# Patient Record
Sex: Female | Born: 1945
Health system: Southern US, Community
[De-identification: ages and names within clinical notes are randomized; demographics above are authoritative.]

## PROBLEM LIST (undated history)

## (undated) DIAGNOSIS — E785 Hyperlipidemia, unspecified: Secondary | ICD-10-CM

## (undated) DIAGNOSIS — E6609 Other obesity due to excess calories: Secondary | ICD-10-CM

## (undated) DIAGNOSIS — I10 Essential (primary) hypertension: Secondary | ICD-10-CM

---

## 1995-06-25 HISTORY — PX: ABDOMINAL HYSTERECTOMY: SHX81

## 2016-03-10 ENCOUNTER — Emergency Department (HOSPITAL_COMMUNITY): Payer: Medicare Other

## 2016-03-10 ENCOUNTER — Encounter (HOSPITAL_COMMUNITY): Payer: Self-pay

## 2016-03-10 ENCOUNTER — Inpatient Hospital Stay (HOSPITAL_COMMUNITY)
Admission: EM | Admit: 2016-03-10 | Discharge: 2016-03-12 | DRG: 305 | Disposition: A | Discharge status: Home / Self Care | Payer: Medicare Other | Attending: Family Medicine | Admitting: Family Medicine

## 2016-03-10 DIAGNOSIS — F17200 Nicotine dependence, unspecified, uncomplicated: Secondary | ICD-10-CM | POA: Diagnosis present

## 2016-03-10 DIAGNOSIS — I160 Hypertensive urgency: Principal | ICD-10-CM | POA: Diagnosis present

## 2016-03-10 DIAGNOSIS — R0602 Shortness of breath: Secondary | ICD-10-CM | POA: Diagnosis not present

## 2016-03-10 DIAGNOSIS — E6609 Other obesity due to excess calories: Secondary | ICD-10-CM | POA: Diagnosis present

## 2016-03-10 DIAGNOSIS — I159 Secondary hypertension, unspecified: Secondary | ICD-10-CM | POA: Diagnosis present

## 2016-03-10 DIAGNOSIS — Z90710 Acquired absence of both cervix and uterus: Secondary | ICD-10-CM

## 2016-03-10 DIAGNOSIS — E785 Hyperlipidemia, unspecified: Secondary | ICD-10-CM | POA: Diagnosis present

## 2016-03-10 DIAGNOSIS — I10 Essential (primary) hypertension: Secondary | ICD-10-CM | POA: Diagnosis present

## 2016-03-10 DIAGNOSIS — Z800 Family history of malignant neoplasm of digestive organs: Secondary | ICD-10-CM

## 2016-03-10 DIAGNOSIS — Z8249 Family history of ischemic heart disease and other diseases of the circulatory system: Secondary | ICD-10-CM

## 2016-03-10 DIAGNOSIS — Z825 Family history of asthma and other chronic lower respiratory diseases: Secondary | ICD-10-CM

## 2016-03-10 DIAGNOSIS — Z6838 Body mass index (BMI) 38.0-38.9, adult: Secondary | ICD-10-CM

## 2016-03-10 DIAGNOSIS — Z8541 Personal history of malignant neoplasm of cervix uteri: Secondary | ICD-10-CM

## 2016-03-10 DIAGNOSIS — E049 Nontoxic goiter, unspecified: Secondary | ICD-10-CM | POA: Diagnosis present

## 2016-03-10 DIAGNOSIS — J42 Unspecified chronic bronchitis: Secondary | ICD-10-CM | POA: Diagnosis present

## 2016-03-10 DIAGNOSIS — Z823 Family history of stroke: Secondary | ICD-10-CM

## 2016-03-10 DIAGNOSIS — I472 Ventricular tachycardia: Secondary | ICD-10-CM | POA: Diagnosis present

## 2016-03-10 HISTORY — DX: Other obesity due to excess calories: E66.09

## 2016-03-10 HISTORY — DX: Essential (primary) hypertension: I10

## 2016-03-10 HISTORY — DX: Hyperlipidemia, unspecified: E78.5

## 2016-03-10 LAB — COMPREHENSIVE METABOLIC PANEL
ALBUMIN: 3.9 g/dL (ref 3.5–5.0)
ALT: 16 U/L (ref 14–54)
ANION GAP: 10 (ref 5–15)
AST: 27 U/L (ref 15–41)
Alkaline Phosphatase: 93 U/L (ref 38–126)
BILIRUBIN TOTAL: 0.7 mg/dL (ref 0.3–1.2)
BUN: 12 mg/dL (ref 6–20)
CO2: 22 mmol/L (ref 22–32)
Calcium: 9 mg/dL (ref 8.9–10.3)
Chloride: 104 mmol/L (ref 101–111)
Creatinine, Ser: 0.85 mg/dL (ref 0.44–1.00)
GFR calc Af Amer: >60 mL/min (ref >60)
GFR calc non Af Amer: >60 mL/min (ref >60)
GLUCOSE: 95 mg/dL (ref 65–99)
POTASSIUM: 4.1 mmol/L (ref 3.5–5.1)
Sodium: 136 mmol/L (ref 135–145)
TOTAL PROTEIN: 8.1 g/dL (ref 6.5–8.1)

## 2016-03-10 LAB — CBC
HCT: 41.7 % (ref 36.0–46.0)
Hemoglobin: 13.2 g/dL (ref 12.0–15.0)
MCH: 24.1 pg — AB (ref 26.0–34.0)
MCHC: 31.7 g/dL (ref 30.0–36.0)
MCV: 76.1 fL — ABNORMAL LOW (ref 78.0–100.0)
PLATELETS: 225 10*3/uL (ref 150–400)
RBC: 5.48 MIL/uL — AB (ref 3.87–5.11)
RDW: 17.2 % — ABNORMAL HIGH (ref 11.5–15.5)
WBC: 8.1 10*3/uL (ref 4.0–10.5)

## 2016-03-10 LAB — I-STAT TROPONIN, ED: TROPONIN I, POC: 0.01 ng/mL (ref 0.00–0.08)

## 2016-03-10 LAB — D-DIMER, QUANTITATIVE (NOT AT ARMC): D DIMER QUANT: 1.24 ug{FEU}/mL — AB (ref 0.00–0.50)

## 2016-03-10 MED ORDER — ALBUTEROL SULFATE (2.5 MG/3ML) 0.083% IN NEBU
5.0000 mg | INHALATION_SOLUTION | Freq: Once | RESPIRATORY_TRACT | Status: AC
  Administered 2016-03-10: 5 mg via RESPIRATORY_TRACT
  Filled 2016-03-10: qty 6

## 2016-03-10 MED ORDER — ASPIRIN 325 MG PO TABS
325.0000 mg | ORAL_TABLET | Freq: Once | ORAL | Status: AC
  Administered 2016-03-11: 325 mg via ORAL
  Filled 2016-03-10: qty 1

## 2016-03-10 MED ORDER — IPRATROPIUM-ALBUTEROL 0.5-2.5 (3) MG/3ML IN SOLN
3.0000 mL | Freq: Once | RESPIRATORY_TRACT | Status: AC
  Administered 2016-03-11: 3 mL via RESPIRATORY_TRACT
  Filled 2016-03-10: qty 3

## 2016-03-10 MED ORDER — IOPAMIDOL (ISOVUE-370) INJECTION 76%
INTRAVENOUS | Status: AC
  Administered 2016-03-10: 70 mL
  Filled 2016-03-10: qty 100

## 2016-03-10 MED ORDER — LABETALOL HCL 5 MG/ML IV SOLN
20.0000 mg | Freq: Once | INTRAVENOUS | Status: AC
  Administered 2016-03-11: 20 mg via INTRAVENOUS
  Filled 2016-03-10: qty 4

## 2016-03-10 NOTE — ED Provider Notes (Signed)
Complains of mild cough, nonproductive and shortness of breath onset earlier today. She denies any chest pain denies fever. Feels somewhat improved after nebulized treatment here. No other associated symptoms. Cardiac risk factors smoker, hypercholesterolemia, hypertension, obesity. Heart score +5 based on risk factors, ekg criteria, age, history   Doug SouSam Carlyle Achenbach, MD 03/11/16 [phone]

## 2016-03-10 NOTE — ED Triage Notes (Signed)
Patient complains of shortness of breath that started this afternoon. No CP. States that it seems hard to take a deep breath. Speaking full sentences, no edema, NAD. No medical hx

## 2016-03-10 NOTE — ED Provider Notes (Signed)
[facility]-EMERGENCY DEPT Provider Note   CSN: [id number] Arrival date & time: 03/10/16  1740     History   Chief Complaint Chief Complaint  Patient presents with  . Shortness of Breath    HPI Mary Cherry is a 70 y.o. female.  Patient is a 70 year old female with hx of HTN and HLD, which she reports she has not been taking medications for the past 10 years, presents the ED with complaint of shortness of breath, onset this afternoon. Patient reports while she was at home with her family, she notes the house began to feel very warm and she began to have difficulty breathing. She states "I felt like I just couldn't catch my breath". She notes after she went into her daughter's room by herself her breathing mildly improved but states due to still feeling short of breath her and her daughter decided to come to the ED for evaluation. Patient states since arrival to the ED her shortness of breath has improved. Patient notes she has had nasal congestion, rhinorrhea and nonproductive cough with mild wheezing for the past 2 days. She reports taking TheraFlu at home with mild relief of nasal congestion. Denies fever, chills, headache, lightheadedness, dizziness, sore throat, hemoptysis, CP, palpitations, abdominal pain, N/V/D, urinary sxs, weakness. Denies any known sick contacts. Patient endorses smoking 1/2-1 PPD. Denies personal or family history of heart disease. Patient reports history of cervical cancer but states she is status post total hysterectomy. Denies any recent hospitalizations/immobilizations, recent surgery, leg swelling, recent long car rides or airplane travel, history of DVTs.      Past Medical History:  Diagnosis Date  . Essential hypertension   . Hyperlipidemia   . Obesity due to excess calories     There are no active problems to display for this patient.   Past Surgical History:  Procedure Laterality Date  . ABDOMINAL HYSTERECTOMY  1997  . CESAREAN SECTION  1984    OB  History    No data available       Home Medications    Prior to Admission medications   Medication Sig Start Date End Date Taking? Authorizing Provider  aspirin EC 81 MG tablet Take 81 mg by mouth daily as needed for mild pain.   Yes Historical Provider, MD  naproxen sodium (ALEVE) 220 MG tablet Take 440 mg by mouth daily as needed (for pain).   Yes Historical Provider, MD    Family History Family History  Problem Relation Age of Onset  . Hypertension Mother   . Emphysema Mother   . Pancreatic cancer Mother   . Heart attack Father   . Hypertension Brother   . Stroke Maternal Aunt     Social History Social History  Substance Use Topics  . Smoking status: Current Every Day Smoker    Types: Cigarettes  . Smokeless tobacco: Never Used  . Alcohol use Not on file     Allergies   Flagyl [metronidazole]   Review of Systems Review of Systems  HENT: Positive for congestion and rhinorrhea.   Respiratory: Positive for cough, shortness of breath and wheezing.   All other systems reviewed and are negative.    Physical Exam Updated Vital Signs BP 147/67   Pulse 81   Temp 98.7 F (37.1 C) (Oral)   Resp 20   LMP  (Exact Date) Comment: hysterectomy   SpO2 (!) 89%   Physical Exam  Constitutional: She is oriented to person, place, and time. She appears well-developed and well-nourished. No  distress.  Morbidly obese female  HENT:  Head: Normocephalic and atraumatic.  Right Ear: Tympanic membrane normal.  Left Ear: Tympanic membrane normal.  Nose: Nose normal. Right sinus exhibits no maxillary sinus tenderness and no frontal sinus tenderness. Left sinus exhibits no maxillary sinus tenderness and no frontal sinus tenderness.  Mouth/Throat: Uvula is midline, oropharynx is clear and moist and mucous membranes are normal. No oropharyngeal exudate, posterior oropharyngeal edema, posterior oropharyngeal erythema or tonsillar abscesses. No tonsillar exudate.  Eyes: Conjunctivae  and EOM are normal. Right eye exhibits no discharge. Left eye exhibits no discharge. No scleral icterus.  Neck: Normal range of motion. Neck supple.  Cardiovascular: Regular rhythm, normal heart sounds and intact distal pulses.   Tachycardic, HR 104-110 during examination  Pulmonary/Chest: Effort normal. No respiratory distress. She has wheezes (mild end-expiratory wheezes noted bilaterally in lower lobes). She has no rales. She exhibits no tenderness.  Abdominal: Soft. Bowel sounds are normal. She exhibits no distension and no mass. There is no tenderness. There is no rebound and no guarding. No hernia.  Musculoskeletal: Normal range of motion. She exhibits no edema.  Lymphadenopathy:    She has no cervical adenopathy.  Neurological: She is alert and oriented to person, place, and time.  Skin: Skin is warm and dry. She is not diaphoretic.  Nursing note and vitals reviewed.    ED Treatments / Results  Labs (all labs ordered are listed, but only abnormal results are displayed) Labs Reviewed  CBC - Abnormal; Notable for the following:       Result Value   RBC 5.48 (*)    MCV 76.1 (*)    MCH 24.1 (*)    RDW 17.2 (*)    All other components within normal limits  D-DIMER, QUANTITATIVE (NOT AT [facility]) - Abnormal; Notable for the following:    D-Dimer, Quant 1.24 (*)    All other components within normal limits  COMPREHENSIVE METABOLIC PANEL  I-STAT TROPOININ, ED  I-STAT TROPOININ, ED    EKG  EKG Interpretation  Date/Time:  Sunday March 10 2016 17:51:10 EDT Ventricular Rate:  114 PR Interval:  158 QRS Duration: 92 QT Interval:  350 QTC Calculation: 482 R Axis:   47 Text Interpretation:  Sinus tachycardia with occasional Premature ventricular complexes Marked ST abnormality, possible inferior subendocardial injury Abnormal ECG No old tracing to compare Confirmed by Ethelda Chick  MD, SAM [phone]) on 03/10/2016 11:04:36 PM       Radiology Dg Chest 2 View  Result Date:  03/10/2016 CLINICAL DATA:  Shortness of breath EXAM: CHEST  2 VIEW COMPARISON:  None. FINDINGS: Normal heart size. Aortic atherosclerosis. Otherwise normal mediastinal contour. No pneumothorax. No pleural effusion. No pulmonary edema. No acute consolidative airspace disease. Slightly distorted appearance of the interstitial markings suggests emphysema. There is narrowing of the right deviated trachea at the level of the thoracic inlet. IMPRESSION: 1. No acute cardiopulmonary disease. 2. Suggestion of emphysema. 3. Aortic atherosclerosis. 4. Narrowing of the right deviated trachea at the level of the thoracic inlet, most commonly due to an asymmetric goiter. Recommend correlation with thyroid ultrasound. Electronically Signed   By: Delbert Phenix M.D.   On: 03/10/2016 18:21   Ct Angio Chest Pe W And/or Wo Contrast  Result Date: 03/11/2016 CLINICAL DATA:  Acute onset of shortness of breath. Recent cold. Initial encounter. EXAM: CT ANGIOGRAPHY CHEST WITH CONTRAST TECHNIQUE: Multidetector CT imaging of the chest was performed using the standard protocol during bolus administration of intravenous contrast. Multiplanar CT image  reconstructions and MIPs were obtained to evaluate the vascular anatomy. CONTRAST:  70 mL of Isovue 370 IV contrast COMPARISON:  Chest radiograph performed earlier today at 5:58 p.m. at the skull is FINDINGS: Cardiovascular:  There is no evidence of pulmonary embolus. The heart is unremarkable in appearance. Scattered coronary artery calcifications are seen. Scattered calcification is also noted at the aortic arch and proximal great vessels. Mediastinum/Nodes: The mediastinum is otherwise unremarkable in appearance. No mediastinal lymphadenopathy is seen. No pericardial effusion is identified. There is marked diffuse enlargement of the thyroid, particularly on the left, with rightward displacement and narrowing of the trachea at this level. No axillary lymphadenopathy is seen. Lungs/Pleura:  Minimal bilateral atelectasis is noted. The lungs are otherwise grossly clear. No focal consolidation, pleural effusion or pneumothorax is seen. No masses are identified. Upper Abdomen: The visualized portions of the liver and spleen are grossly unremarkable. The visualized portions of the pancreas, adrenal glands and kidneys are within normal limits. Musculoskeletal: No acute osseous abnormalities are identified. Multilevel vacuum phenomenon is noted along the lower thoracic spine. The visualized musculature is unremarkable in appearance. Review of the MIP images confirms the above findings. IMPRESSION: 1. No evidence of pulmonary embolus. 2. Scattered coronary artery calcifications seen. 3. Minimal bilateral atelectasis noted. Lungs otherwise grossly clear. 4. **An incidental finding of potential clinical significance has been found. Marked diffuse enlargement of the thyroid, particularly on the left, with rightward displacement and narrowing of the trachea at this level. Though this could reflect a large thyroid goiter, an underlying left thyroid mass cannot be excluded. Recommend further evaluation with thyroid ultrasound. If patient is clinically hyperthyroid, consider nuclear medicine thyroid uptake and scan.** Electronically Signed   By: Roanna Raider M.D.   On: 03/11/2016 01:17    Procedures Procedures (including critical care time)  Medications Ordered in ED Medications  albuterol (PROVENTIL) (2.5 MG/3ML) 0.083% nebulizer solution 5 mg (5 mg Nebulization Given 03/10/16 2126)  labetalol (NORMODYNE,TRANDATE) injection 20 mg (20 mg Intravenous Given 03/11/16 0019)  ipratropium-albuterol (DUONEB) 0.5-2.5 (3) MG/3ML nebulizer solution 3 mL (3 mLs Nebulization Given 03/11/16 0016)  aspirin tablet 325 mg (325 mg Oral Given 03/11/16 0018)  iopamidol (ISOVUE-370) 76 % injection (70 mLs  Contrast Given 03/10/16 2357)     Initial Impression / Assessment and Plan / ED Course  I have reviewed the triage  vital signs and the nursing notes.  Pertinent labs & imaging results that were available during my care of the patient were reviewed by me and considered in my medical decision making (see chart for details).  Clinical Course   Patient presents with shortness of breath that started this afternoon. Endorses associated nasal congestion, rhinorrhea, nonproductive cough and wheezing for the past 2 days which she notes has mildly improved after taking TheraFlu. Denies fever, chest pain. History of hypertension and hyperlipidemia but notes she has not been taking any medications or followed up with a PCP in the past 10 years. Initial BP 238/122, no signs of end organ damage, heart rate 110, remaining vital stable. Exam revealed wheezes noted diffusely, patient without signs of respiratory distress. Remaining exam unremarkable. Patient given albuterol neb treatment,  20 mg labetalol and ASA 325mg .   EKG showed sinus tachycardia with mild ST depressions/irregularity in inferior leads, no prior EKG to compare. Chest x-ray showed no acute findings. Troponin negative. D-dimer 1.24, CT angio ordered for evaluation of PE. HEART score 5. On reevaluation patient reports her shortness of breath has improved. Reexamination revealed mild  improvement of wheezing, DuoNeb treatment ordered.  CT angio revealed no evidence of PE. Incidental finding of diffuse enlargement of thyroid with rightward displacement in the area of trachea which could reflect large thyroid goiter, recommend thyroid ultrasound as further evaluation. Plan to admit pt for cardiac rule out. Dr. Maryfrances Bunnellanford agrees to admission. Discussed results and plan for admission with patient and family.  Final Clinical Impressions(s) / ED Diagnoses   Final diagnoses:  SOB (shortness of breath)  Secondary hypertension, unspecified    New Prescriptions New Prescriptions   No medications on file     Barrett Henleicole Elizabeth Nadeau, PA-C 03/11/16 [id number]    Doug SouSam  Jacubowitz, MD 03/11/16 1233

## 2016-03-11 ENCOUNTER — Encounter (HOSPITAL_COMMUNITY): Payer: Self-pay | Admitting: Family Medicine

## 2016-03-11 DIAGNOSIS — J42 Unspecified chronic bronchitis: Secondary | ICD-10-CM | POA: Diagnosis present

## 2016-03-11 DIAGNOSIS — E785 Hyperlipidemia, unspecified: Secondary | ICD-10-CM | POA: Diagnosis present

## 2016-03-11 DIAGNOSIS — R0602 Shortness of breath: Secondary | ICD-10-CM | POA: Diagnosis present

## 2016-03-11 DIAGNOSIS — I160 Hypertensive urgency: Principal | ICD-10-CM

## 2016-03-11 DIAGNOSIS — I159 Secondary hypertension, unspecified: Secondary | ICD-10-CM | POA: Diagnosis present

## 2016-03-11 DIAGNOSIS — E049 Nontoxic goiter, unspecified: Secondary | ICD-10-CM

## 2016-03-11 DIAGNOSIS — Z800 Family history of malignant neoplasm of digestive organs: Secondary | ICD-10-CM | POA: Diagnosis not present

## 2016-03-11 DIAGNOSIS — I10 Essential (primary) hypertension: Secondary | ICD-10-CM

## 2016-03-11 DIAGNOSIS — Z90710 Acquired absence of both cervix and uterus: Secondary | ICD-10-CM | POA: Diagnosis not present

## 2016-03-11 DIAGNOSIS — Z825 Family history of asthma and other chronic lower respiratory diseases: Secondary | ICD-10-CM | POA: Diagnosis not present

## 2016-03-11 DIAGNOSIS — E6609 Other obesity due to excess calories: Secondary | ICD-10-CM | POA: Diagnosis present

## 2016-03-11 DIAGNOSIS — F17200 Nicotine dependence, unspecified, uncomplicated: Secondary | ICD-10-CM | POA: Diagnosis present

## 2016-03-11 DIAGNOSIS — Z8541 Personal history of malignant neoplasm of cervix uteri: Secondary | ICD-10-CM | POA: Diagnosis not present

## 2016-03-11 DIAGNOSIS — Z8249 Family history of ischemic heart disease and other diseases of the circulatory system: Secondary | ICD-10-CM | POA: Diagnosis not present

## 2016-03-11 DIAGNOSIS — Z6838 Body mass index (BMI) 38.0-38.9, adult: Secondary | ICD-10-CM | POA: Diagnosis not present

## 2016-03-11 DIAGNOSIS — Z823 Family history of stroke: Secondary | ICD-10-CM | POA: Diagnosis not present

## 2016-03-11 DIAGNOSIS — I472 Ventricular tachycardia: Secondary | ICD-10-CM | POA: Diagnosis present

## 2016-03-11 DIAGNOSIS — R0600 Dyspnea, unspecified: Secondary | ICD-10-CM | POA: Diagnosis not present

## 2016-03-11 HISTORY — DX: Essential (primary) hypertension: I10

## 2016-03-11 LAB — TSH: TSH: 1.262 u[IU]/mL (ref 0.350–4.500)

## 2016-03-11 LAB — MRSA PCR SCREENING: MRSA BY PCR: NEGATIVE

## 2016-03-11 LAB — BRAIN NATRIURETIC PEPTIDE: B NATRIURETIC PEPTIDE 5: 303.2 pg/mL — AB (ref 0.0–100.0)

## 2016-03-11 LAB — I-STAT TROPONIN, ED: Troponin i, poc: 0.04 ng/mL (ref 0.00–0.08)

## 2016-03-11 LAB — TROPONIN I
TROPONIN I: 0.05 ng/mL — AB (ref <0.03)
Troponin I: 0.06 ng/mL (ref <0.03)
Troponin I: 0.05 ng/mL (ref <0.03)

## 2016-03-11 MED ORDER — ENOXAPARIN SODIUM 40 MG/0.4ML ~~LOC~~ SOLN
40.0000 mg | SUBCUTANEOUS | Status: DC
  Administered 2016-03-11 – 2016-03-12 (×2): 40 mg via SUBCUTANEOUS
  Filled 2016-03-11 (×2): qty 0.4

## 2016-03-11 MED ORDER — HYDROCHLOROTHIAZIDE 25 MG PO TABS
25.0000 mg | ORAL_TABLET | Freq: Every day | ORAL | Status: DC
  Administered 2016-03-11 – 2016-03-12 (×2): 25 mg via ORAL
  Filled 2016-03-11 (×2): qty 1

## 2016-03-11 MED ORDER — FUROSEMIDE 10 MG/ML IJ SOLN
40.0000 mg | Freq: Once | INTRAMUSCULAR | Status: AC
  Administered 2016-03-11: 40 mg via INTRAVENOUS
  Filled 2016-03-11: qty 4

## 2016-03-11 MED ORDER — HYDRALAZINE HCL 20 MG/ML IJ SOLN
10.0000 mg | Freq: Four times a day (QID) | INTRAMUSCULAR | Status: DC | PRN
  Administered 2016-03-11: 10 mg via INTRAVENOUS
  Filled 2016-03-11 (×2): qty 1

## 2016-03-11 MED ORDER — AMLODIPINE BESYLATE 5 MG PO TABS
5.0000 mg | ORAL_TABLET | Freq: Every day | ORAL | Status: DC
  Administered 2016-03-11 – 2016-03-12 (×2): 5 mg via ORAL
  Filled 2016-03-11 (×2): qty 1

## 2016-03-11 MED ORDER — LABETALOL HCL 5 MG/ML IV SOLN
0.5000 mg/min | INTRAVENOUS | Status: DC
  Filled 2016-03-11: qty 100

## 2016-03-11 MED ORDER — ACETAMINOPHEN 650 MG RE SUPP: 650.0000 mg | Freq: Four times a day (QID) | RECTAL | Status: DC | PRN

## 2016-03-11 MED ORDER — ASPIRIN EC 81 MG PO TBEC: 81.0000 mg | DELAYED_RELEASE_TABLET | Freq: Every day | ORAL | Status: DC | PRN

## 2016-03-11 MED ORDER — SODIUM CHLORIDE 0.9% FLUSH
3.0000 mL | Freq: Two times a day (BID) | INTRAVENOUS | Status: DC
  Administered 2016-03-11 – 2016-03-12 (×3): 3 mL via INTRAVENOUS

## 2016-03-11 MED ORDER — ACETAMINOPHEN 325 MG PO TABS: 650.0000 mg | ORAL_TABLET | Freq: Four times a day (QID) | ORAL | Status: DC | PRN

## 2016-03-11 MED ORDER — LABETALOL HCL 5 MG/ML IV SOLN
10.0000 mg | Freq: Once | INTRAVENOUS | Status: AC
  Administered 2016-03-12: 10 mg via INTRAVENOUS
  Filled 2016-03-11: qty 4

## 2016-03-11 NOTE — ED Notes (Signed)
Report attempted again. 

## 2016-03-11 NOTE — ED Notes (Signed)
Pt awakened  To give bp med

## 2016-03-11 NOTE — ED Notes (Signed)
Thew pt returned from c-t  hhn given med given

## 2016-03-11 NOTE — Progress Notes (Signed)
PROGRESS NOTE  Mary Cherry  RUE:[id number]:[id number] DOB: Apr 24, 1946 DOA: 03/10/2016 PCP: No PCP Per Patient Outpatient Specialists:  Subjective: Seen with her daughter at bedside. Still have some heavy breathing  Brief Narrative:  Mary LitterBernice Kober is a 70 y.o. female with a past medical history significant for HTN who presents with shortness of breath.  The patient was in her usual state of health until tonight, "I think there were too many kids running around the house" and she felt like it got hot in the house, then she went outside and it was hot in there then she felt like she couldn't breathe, "and I think it was all a panic attack."  She was able to calm down a little in her daughter's room, but her daughter still convinced her to come to the ER.   Assessment & Plan:   Active Problems:   Essential hypertension   Hypertensive urgency   Hyperlipidemia   Thyroid enlargement   This is a no charge note, patient seen earlier today by my colleague Dr. Maryfrances Bunnellanford. Patient admitted with hypertensive urgency, blood pressure went up to 222/100. Patient received amlodipine and hydrochlorothiazide orally and labetalol IV still blood pressure elevated above 184 systolic. Was mildly hypoxic at 89% on room air. Place on labetalol drip.   DVT prophylaxis: Subcutaneous heparin Code Status: Full Code Family Communication:  Disposition Plan:  Diet: Diet Heart Room service appropriate? Yes; Fluid consistency: Thin  Consultants:   None  Procedures:   None  Antimicrobials:   None   Objective: Vitals:   03/11/16 1230 03/11/16 1300 03/11/16 1330 03/11/16 1400  BP: 169/73 177/63 182/64 184/66  Pulse: 74 72 80 80  Resp: 24 19 25 17   Temp:      TempSrc:      SpO2: 96% 93% 99% 92%   No intake or output data in the 24 hours ending 03/11/16 1407 There were no vitals filed for this visit.  Examination: General exam: Appears calm and comfortable  Respiratory system: Clear to auscultation.  Respiratory effort normal. Cardiovascular system: S1 & S2 heard, RRR. No JVD, murmurs, rubs, gallops or clicks. No pedal edema. Gastrointestinal system: Abdomen is nondistended, soft and nontender. No organomegaly or masses felt. Normal bowel sounds heard. Central nervous system: Alert and oriented. No focal neurological deficits. Extremities: Symmetric 5 x 5 power. Skin: No rashes, lesions or ulcers Psychiatry: Judgement and insight appear normal. Mood & affect appropriate.   Data Reviewed: I have personally reviewed following labs and imaging studies  CBC:  Recent Labs Lab 03/10/16 1800  WBC 8.1  HGB 13.2  HCT 41.7  MCV 76.1*  PLT 225   Basic Metabolic Panel:  Recent Labs Lab 03/10/16 1800  NA 136  K 4.1  CL 104  CO2 22  GLUCOSE 95  BUN 12  CREATININE 0.85  CALCIUM 9.0   GFR: CrCl cannot be calculated (Unknown ideal weight.). Liver Function Tests:  Recent Labs Lab 03/10/16 1800  AST 27  ALT 16  ALKPHOS 93  BILITOT 0.7  PROT 8.1  ALBUMIN 3.9   No results for input(s): LIPASE, AMYLASE in the last 168 hours. No results for input(s): AMMONIA in the last 168 hours. Coagulation Profile: No results for input(s): INR, PROTIME in the last 168 hours. Cardiac Enzymes:  Recent Labs Lab 03/11/16 1039  TROPONINI 0.06*   BNP (last 3 results) No results for input(s): PROBNP in the last 8760 hours. HbA1C: No results for input(s): HGBA1C in the last 72 hours. CBG: No  results for input(s): GLUCAP in the last 168 hours. Lipid Profile: No results for input(s): CHOL, HDL, LDLCALC, TRIG, CHOLHDL, LDLDIRECT in the last 72 hours. Thyroid Function Tests:  Recent Labs  03/11/16 1039  TSH 1.262   Anemia Panel: No results for input(s): VITAMINB12, FOLATE, FERRITIN, TIBC, IRON, RETICCTPCT in the last 72 hours. Urine analysis: No results found for: COLORURINE, APPEARANCEUR, LABSPEC, PHURINE, GLUCOSEU, HGBUR, BILIRUBINUR, KETONESUR, PROTEINUR, UROBILINOGEN, NITRITE,  LEUKOCYTESUR Sepsis Labs: @LABRCNTIP (procalcitonin:4,lacticidven:4)  )No results found for this or any previous visit (from the past 240 hour(s)).   Invalid input(s): PROCALCITONIN, LACTICACIDVEN   Radiology Studies: Dg Chest 2 View  Result Date: 03/10/2016 CLINICAL DATA:  Shortness of breath EXAM: CHEST  2 VIEW COMPARISON:  None. FINDINGS: Normal heart size. Aortic atherosclerosis. Otherwise normal mediastinal contour. No pneumothorax. No pleural effusion. No pulmonary edema. No acute consolidative airspace disease. Slightly distorted appearance of the interstitial markings suggests emphysema. There is narrowing of the right deviated trachea at the level of the thoracic inlet. IMPRESSION: 1. No acute cardiopulmonary disease. 2. Suggestion of emphysema. 3. Aortic atherosclerosis. 4. Narrowing of the right deviated trachea at the level of the thoracic inlet, most commonly due to an asymmetric goiter. Recommend correlation with thyroid ultrasound. Electronically Signed   By: Delbert Phenix M.D.   On: 03/10/2016 18:21   Ct Angio Chest Pe W And/or Wo Contrast  Result Date: 03/11/2016 CLINICAL DATA:  Acute onset of shortness of breath. Recent cold. Initial encounter. EXAM: CT ANGIOGRAPHY CHEST WITH CONTRAST TECHNIQUE: Multidetector CT imaging of the chest was performed using the standard protocol during bolus administration of intravenous contrast. Multiplanar CT image reconstructions and MIPs were obtained to evaluate the vascular anatomy. CONTRAST:  70 mL of Isovue 370 IV contrast COMPARISON:  Chest radiograph performed earlier today at 5:58 p.m. at the skull is FINDINGS: Cardiovascular:  There is no evidence of pulmonary embolus. The heart is unremarkable in appearance. Scattered coronary artery calcifications are seen. Scattered calcification is also noted at the aortic arch and proximal great vessels. Mediastinum/Nodes: The mediastinum is otherwise unremarkable in appearance. No mediastinal  lymphadenopathy is seen. No pericardial effusion is identified. There is marked diffuse enlargement of the thyroid, particularly on the left, with rightward displacement and narrowing of the trachea at this level. No axillary lymphadenopathy is seen. Lungs/Pleura: Minimal bilateral atelectasis is noted. The lungs are otherwise grossly clear. No focal consolidation, pleural effusion or pneumothorax is seen. No masses are identified. Upper Abdomen: The visualized portions of the liver and spleen are grossly unremarkable. The visualized portions of the pancreas, adrenal glands and kidneys are within normal limits. Musculoskeletal: No acute osseous abnormalities are identified. Multilevel vacuum phenomenon is noted along the lower thoracic spine. The visualized musculature is unremarkable in appearance. Review of the MIP images confirms the above findings. IMPRESSION: 1. No evidence of pulmonary embolus. 2. Scattered coronary artery calcifications seen. 3. Minimal bilateral atelectasis noted. Lungs otherwise grossly clear. 4. **An incidental finding of potential clinical significance has been found. Marked diffuse enlargement of the thyroid, particularly on the left, with rightward displacement and narrowing of the trachea at this level. Though this could reflect a large thyroid goiter, an underlying left thyroid mass cannot be excluded. Recommend further evaluation with thyroid ultrasound. If patient is clinically hyperthyroid, consider nuclear medicine thyroid uptake and scan.** Electronically Signed   By: Roanna Raider M.D.   On: 03/11/2016 01:17        Scheduled Meds: . amLODipine  5 mg Oral Daily  .  enoxaparin (LOVENOX) injection  40 mg Subcutaneous Q24H  . hydrochlorothiazide  25 mg Oral Daily  . sodium chloride flush  3 mL Intravenous Q12H   Continuous Infusions:    LOS: 0 days    Time spent: 35 minutes    Daveda Larock A, MD [facility] Pager [phone]  If 7PM-7AM, please  contact night-coverage [email or web address] Password [email or web address] 03/11/2016, 2:07 PM

## 2016-03-11 NOTE — ED Notes (Signed)
Pt placed in a regular hosp bed.  Pt up to the br  Blanket placed for comfort

## 2016-03-11 NOTE — ED Notes (Signed)
Report attempted to [phone]

## 2016-03-11 NOTE — ED Notes (Signed)
0.06 troponin

## 2016-03-11 NOTE — ED Notes (Signed)
Pt. Does not want to admitted, spoke with the hospitalist and he is coming down for a consult because patients BP is high

## 2016-03-11 NOTE — H&P (Signed)
History and Physical  Patient Name: Mary Cherry     ZOX:[id number]    DOB: 10/28/1945    DOA: 03/10/2016 PCP: No PCP Per Patient   Patient coming from: Home  Chief Complaint: Shortness of breath  HPI: Mary Cherry is a 70 y.o. female with a past medical history significant for HTN who presents with shortness of breath.  The patient was in her usual state of health until tonight, "I think there were too many kids running around the house" and she felt like it got hot in the house, then she went outside and it was hot in there then she felt like she couldn't breathe, "and I think it was all a panic attack."  She was able to calm down a little in her daughter's room, but her daughter still convinced her to come to the ER.    ED course: -Afebrile, heart rate 120s, respirations 20, blood pressure 238/122, pulse oximetry normal -Na 136, K 4.1, Cr 0.85 (baseline unknown), WBC 8.1K, Hgb 13.2 -Troponin negative -D-dimer elevated, chest x-ray only nonspecific interstitial markings increased, consistent with chronic bronchitis -CT angiogram was obtained which showed no PE, no edema, no dissection, and no pneumonia but did show incidental left thyroid enlargement -She was given nebulizers and labetalol IV and TRH were asked to evaluate   She is from [place] originally, moved to [place] 10 years ago. She has had no primary care physician since moving down here, remembers taking blood pressure pills when she was in [place], but has taken none since. She does not follow regularly with a physician, has never had a heart attack or stroke that she can remember, and smokes daily.  Daughter notes that family life has been "hectic" recently, and they eat out all the time, "probably eat at [employer] three times a day".  They've also found a new food (or restaurant?) called "cheese crack", which they believe is high in sodium.         ROS: Review of Systems  Constitutional: Negative for fever  and weight loss.  HENT: Negative for sore throat (neck pain).   Eyes: Negative for blurred vision.  Respiratory: Positive for shortness of breath. Negative for cough, sputum production and wheezing.   Cardiovascular: Negative for chest pain, palpitations, orthopnea, leg swelling and PND.  Gastrointestinal: Negative for constipation and diarrhea.  Skin: Negative for rash.  Neurological: Negative for headaches.  Endo/Heme/Allergies: Negative.        No weight loss, diarrhea, hair loss, skin changes, palpitations, eye changes, heat intolerance.  All other systems reviewed and are negative.         Past Medical History:  Diagnosis Date  . Essential hypertension   . Essential hypertension 03/11/2016  . Hyperlipidemia   . Obesity due to excess calories     Past Surgical History:  Procedure Laterality Date  . ABDOMINAL HYSTERECTOMY  1997  . CESAREAN SECTION  1984    Social History: Patient lives With her daughter and her daughter's children.  The patient walks unassisted. She is from [place]. She smokes daily. She does not use alcohol to excess.    Allergies  Allergen Reactions  . Flagyl [Metronidazole] Hives    Family history: family history includes Emphysema in her mother; Heart attack in her father; Hypertension in her brother and mother; Pancreatic cancer in her mother; Stroke in her maternal aunt.  Prior to Admission medications   Medication Sig Start Date End Date Taking? Authorizing Provider  aspirin  EC 81 MG tablet Take 81 mg by mouth daily as needed for mild pain.   Yes Historical Provider, MD  naproxen sodium (ALEVE) 220 MG tablet Take 440 mg by mouth daily as needed (for pain).   Yes Historical Provider, MD       Physical Exam: BP 147/67   Pulse 81   Temp 98.7 F (37.1 C) (Oral)   Resp 20   LMP  (Exact Date) Comment: hysterectomy   SpO2 (!) 89%  General appearance: Well-developed, obese older adult female, alert and in no acute distress, appears  comfortable.   Eyes: No exophthalmos.  Anicteric, conjunctiva pink, lids and lashes normal. PERRL.    ENT: No nasal deformity, discharge, epistaxis.  Hearing normal. OP moist without lesions.   Neck: CT reports a left thyroid mass, although I feel more fullness in the right neck.  No thryoid tenderness. Lymph: No cervical or supraclavicular lymphadenopathy. Skin: Warm and without diaphoresis.  No suspicious rashes or lesions.  No skin changes on shin. Cardiac: RRR, nl S1-S2, no murmurs appreciated.  Capillary refill is brisk.  JVP normal.  No LE edema.  Radial pulses 2+ and symmetric. DP pulses diminished. Respiratory: Normal respiratory rate and rhythm.  CTAB without rales or wheezes, scattered coarse breath sounds with exhalation. MSK: No deformities or effusions.  No cyanosis or clubbing. Neuro: Cranial nerves normal.  Sensation intact to light touch. Speech is fluent.  Muscle strength normal.    Psych: Sensorium intact and responding to questions, attention normal.  Behavior appropriate.  Affect normal.  Judgment and insight appear normal.     Labs on Admission:  I have personally reviewed following labs and imaging studies: CBC:  Recent Labs Lab 03/10/16 1800  WBC 8.1  HGB 13.2  HCT 41.7  MCV 76.1*  PLT 225   Basic Metabolic Panel:  Recent Labs Lab 03/10/16 1800  NA 136  K 4.1  CL 104  CO2 22  GLUCOSE 95  BUN 12  CREATININE 0.85  CALCIUM 9.0   GFR: CrCl cannot be calculated (Unknown ideal weight.).  Liver Function Tests:  Recent Labs Lab 03/10/16 1800  AST 27  ALT 16  ALKPHOS 93  BILITOT 0.7  PROT 8.1  ALBUMIN 3.9   No results for input(s): LIPASE, AMYLASE in the last 168 hours. No results for input(s): AMMONIA in the last 168 hours. Coagulation Profile: No results for input(s): INR, PROTIME in the last 168 hours. Cardiac Enzymes: No results for input(s): CKTOTAL, CKMB, CKMBINDEX, TROPONINI in the last 168 hours. BNP (last 3 results) No results for  input(s): PROBNP in the last 8760 hours. HbA1C: No results for input(s): HGBA1C in the last 72 hours. CBG: No results for input(s): GLUCAP in the last 168 hours. Lipid Profile: No results for input(s): CHOL, HDL, LDLCALC, TRIG, CHOLHDL, LDLDIRECT in the last 72 hours. Thyroid Function Tests: No results for input(s): TSH, T4TOTAL, FREET4, T3FREE, THYROIDAB in the last 72 hours. Anemia Panel: No results for input(s): VITAMINB12, FOLATE, FERRITIN, TIBC, IRON, RETICCTPCT in the last 72 hours. Sepsis Labs: Invalid input(s): PROCALCITONIN, LACTICIDVEN No results found for this or any previous visit (from the past 240 hour(s)).       Radiological Exams on Admission: Personally reviewed: Dg Chest 2 View  Result Date: 03/10/2016 CLINICAL DATA:  Shortness of breath EXAM: CHEST  2 VIEW COMPARISON:  None. FINDINGS: Normal heart size. Aortic atherosclerosis. Otherwise normal mediastinal contour. No pneumothorax. No pleural effusion. No pulmonary edema. No acute consolidative airspace disease.  Slightly distorted appearance of the interstitial markings suggests emphysema. There is narrowing of the right deviated trachea at the level of the thoracic inlet. IMPRESSION: 1. No acute cardiopulmonary disease. 2. Suggestion of emphysema. 3. Aortic atherosclerosis. 4. Narrowing of the right deviated trachea at the level of the thoracic inlet, most commonly due to an asymmetric goiter. Recommend correlation with thyroid ultrasound. Electronically Signed   By: Delbert PhenixJason A Poff M.D.   On: 03/10/2016 18:21   Ct Angio Chest Pe W And/or Wo Contrast  Result Date: 03/11/2016 CLINICAL DATA:  Acute onset of shortness of breath. Recent cold. Initial encounter. EXAM: CT ANGIOGRAPHY CHEST WITH CONTRAST TECHNIQUE: Multidetector CT imaging of the chest was performed using the standard protocol during bolus administration of intravenous contrast. Multiplanar CT image reconstructions and MIPs were obtained to evaluate the vascular  anatomy. CONTRAST:  70 mL of Isovue 370 IV contrast COMPARISON:  Chest radiograph performed earlier today at 5:58 p.m. at the skull is FINDINGS: Cardiovascular:  There is no evidence of pulmonary embolus. The heart is unremarkable in appearance. Scattered coronary artery calcifications are seen. Scattered calcification is also noted at the aortic arch and proximal great vessels. Mediastinum/Nodes: The mediastinum is otherwise unremarkable in appearance. No mediastinal lymphadenopathy is seen. No pericardial effusion is identified. There is marked diffuse enlargement of the thyroid, particularly on the left, with rightward displacement and narrowing of the trachea at this level. No axillary lymphadenopathy is seen. Lungs/Pleura: Minimal bilateral atelectasis is noted. The lungs are otherwise grossly clear. No focal consolidation, pleural effusion or pneumothorax is seen. No masses are identified. Upper Abdomen: The visualized portions of the liver and spleen are grossly unremarkable. The visualized portions of the pancreas, adrenal glands and kidneys are within normal limits. Musculoskeletal: No acute osseous abnormalities are identified. Multilevel vacuum phenomenon is noted along the lower thoracic spine. The visualized musculature is unremarkable in appearance. Review of the MIP images confirms the above findings. IMPRESSION: 1. No evidence of pulmonary embolus. 2. Scattered coronary artery calcifications seen. 3. Minimal bilateral atelectasis noted. Lungs otherwise grossly clear. 4. **An incidental finding of potential clinical significance has been found. Marked diffuse enlargement of the thyroid, particularly on the left, with rightward displacement and narrowing of the trachea at this level. Though this could reflect a large thyroid goiter, an underlying left thyroid mass cannot be excluded. Recommend further evaluation with thyroid ultrasound. If patient is clinically hyperthyroid, consider nuclear medicine  thyroid uptake and scan.** Electronically Signed   By: Roanna RaiderJeffery  Chang M.D.   On: 03/11/2016 01:17    EKG: Independently reviewed. Rate 114, wandering baseline makes interpretation difficult, do not agree that there are ST depressions laterally.    Assessment/Plan 1. Asymptomatic hypertensive urgency in chronic hypertension:  Was previously on medicine, she does not know what.  Suspect this is from high salt intake on chronic untreated hypertension.   No troponin leak, pulmonary edema or renal failure.  Mentating fine and comfortable now. -Will check TSH given CT findings -If TSH elevated, will obtain uptake study -Start amlodipine and HCTZ -Daughter has PCP who will accept patient's insurance, she believes, so patient can follow up with her -Check BNP and if elevated, obtain echo -Finish cycling troponins today   2. Thyroid enlargement:  -Will need thyroid US as outpatient if TSH normal      DVT prophylaxis: Lovenox  Code Status: FULL  Family Communication: Duaghter at bedside  Disposition Plan: Anticipate check TSH and if normal, discharge today Consults called: None Admission status:  OBS, tele At the point of initial evaluation, it is my clinical opinion that admission for OBSERVATION is reasonable and necessary because the patient's presenting complaints in the context of their chronic conditions represent sufficient risk of deterioration or significant morbidity to constitute reasonable grounds for close observation in the hospital setting, but that the patient may be medically stable for discharge from the hospital within 24 to 48 hours.    Medical decision making: Patient seen at 2:25 AM on 03/11/2016.  The patient was discussed with Melburn Hake, PA-C.  What exists of the patient's chart was reviewed in depth and summarized above.  Clinical condition: stable.        Alberteen Sam [facility] Pager [phone]

## 2016-03-11 NOTE — Care Management Obs Status (Signed)
MEDICARE OBSERVATION STATUS NOTIFICATION   Patient Details  Name: Mary Cherry MRN: [id number] Date of Birth: 1946/03/03   Medicare Observation Status Notification Given:  Yes    Vangie BickerBrown, Trude Cansler Jane, RN 03/11/2016, 9:51 AM

## 2016-03-11 NOTE — ED Notes (Signed)
Family leaving for now

## 2016-03-11 NOTE — Progress Notes (Signed)
Pt arrived to [facility] BP 218/96. Labetalol drip ordered. RN called MD to verify order. Pt B/p now 170/78. New orders received to hold labetalol drip, and give IV hydralazine Q6 Prn for Systolic BP greater than 180.

## 2016-03-11 NOTE — ED Notes (Signed)
Bed control called to confirm bed request

## 2016-03-11 NOTE — ED Notes (Signed)
Waiting to get bp meds

## 2016-03-12 ENCOUNTER — Inpatient Hospital Stay (HOSPITAL_COMMUNITY): Payer: Medicare Other

## 2016-03-12 DIAGNOSIS — R0600 Dyspnea, unspecified: Secondary | ICD-10-CM

## 2016-03-12 LAB — BASIC METABOLIC PANEL
Anion gap: 11 (ref 5–15)
BUN: 15 mg/dL (ref 6–20)
CALCIUM: 9.3 mg/dL (ref 8.9–10.3)
CHLORIDE: 99 mmol/L — AB (ref 101–111)
CO2: 26 mmol/L (ref 22–32)
CREATININE: 0.97 mg/dL (ref 0.44–1.00)
GFR calc Af Amer: >60 mL/min (ref >60)
GFR calc non Af Amer: 58 mL/min — ABNORMAL LOW (ref >60)
Glucose, Bld: 98 mg/dL (ref 65–99)
Potassium: 3.4 mmol/L — ABNORMAL LOW (ref 3.5–5.1)
SODIUM: 136 mmol/L (ref 135–145)

## 2016-03-12 LAB — CBC
HEMATOCRIT: 40.8 % (ref 36.0–46.0)
HEMOGLOBIN: 12.8 g/dL (ref 12.0–15.0)
MCH: 23.5 pg — AB (ref 26.0–34.0)
MCHC: 31.4 g/dL (ref 30.0–36.0)
MCV: 74.9 fL — ABNORMAL LOW (ref 78.0–100.0)
Platelets: 221 10*3/uL (ref 150–400)
RBC: 5.45 MIL/uL — ABNORMAL HIGH (ref 3.87–5.11)
RDW: 17.4 % — AB (ref 11.5–15.5)
WBC: 4.8 10*3/uL (ref 4.0–10.5)

## 2016-03-12 LAB — ECHOCARDIOGRAM COMPLETE
Height: 65 in
WEIGHTICAEL: 3720 [oz_av]

## 2016-03-12 LAB — TROPONIN I: Troponin I: 0.04 ng/mL (ref <0.03)

## 2016-03-12 MED ORDER — METOPROLOL TARTRATE 25 MG PO TABS
25.0000 mg | ORAL_TABLET | Freq: Two times a day (BID) | ORAL | Status: DC
  Administered 2016-03-12: 25 mg via ORAL
  Filled 2016-03-12: qty 1

## 2016-03-12 MED ORDER — METOPROLOL TARTRATE 25 MG PO TABS: 25.0000 mg | ORAL_TABLET | Freq: Two times a day (BID) | ORAL | 1 refills | Status: DC

## 2016-03-12 MED ORDER — HYDROCHLOROTHIAZIDE 25 MG PO TABS: 25.0000 mg | ORAL_TABLET | Freq: Every day | ORAL | 1 refills | Status: DC

## 2016-03-12 MED ORDER — FUROSEMIDE 10 MG/ML IJ SOLN
40.0000 mg | Freq: Once | INTRAMUSCULAR | Status: AC
  Administered 2016-03-12: 40 mg via INTRAVENOUS
  Filled 2016-03-12: qty 4

## 2016-03-12 MED ORDER — POTASSIUM CHLORIDE CRYS ER 20 MEQ PO TBCR
40.0000 meq | EXTENDED_RELEASE_TABLET | Freq: Four times a day (QID) | ORAL | Status: AC
  Administered 2016-03-12 (×2): 40 meq via ORAL
  Filled 2016-03-12 (×2): qty 2

## 2016-03-12 MED ORDER — AMLODIPINE BESYLATE 5 MG PO TABS: 5.0000 mg | ORAL_TABLET | Freq: Every day | ORAL | 1 refills | Status: DC

## 2016-03-12 NOTE — Discharge Summary (Signed)
Physician Discharge Summary  Mary Cherry:[id number] DOB: 26-Dec-1945 DOA: 03/10/2016  PCP: No PCP Per Patient  Admit date: 03/10/2016 Discharge date: 03/12/2016  Admitted From: Home Disposition: Home  Recommendations for Outpatient Follow-up:  1. Follow up with PCP in 1-2 weeks 2. Please obtain BMP/CBC in one week 3. Please refer to endocrinology as outpatient for thyroid enlargement.  Home Health: NA Equipment/Devices: NA  Discharge Condition:Stable CODE STATUS: Full code Diet recommendation: Heart Healthy  Brief/Interim Summary: Mary Cherry is a 70 y.o. female with a past medical history significant for HTN who presents with shortness of breath.  The patient was in her usual state of health until tonight, "I think there were too many kids running around the house" and she felt like it got hot in the house, then she went outside and it was hot in there then she felt like she couldn't breathe, "and I think it was all a panic attack." She was able to calm down a little in her daughter's room, but her daughter still convinced her to come to the ER.    Discharge Diagnoses:  Active Problems:   Essential hypertension   Hypertensive urgency   Hyperlipidemia   Thyroid enlargement   Hypertensive urgency in chronic hypertension:  -Presented with shortness of breath, was found to have blood pressure 200/100. -Was previously on medicine, she does not know what.   -Suspect this is from high salt intake on chronic untreated hypertension. -Troponin were slightly elevated at 0.06, no chest pain no evidence of ACS this is likely secondary to hypertensive urgency. -Initially started on amlodipine, hydrochlorothiazide and given doses of IV labetalol without response. -When blood pressure was 200/100 labetalol drip ordered. -Blood pressure unexpectedly improved, labetalol drip was not started. -Treated with amlodipine, HCTZ and metoprolol and as needed hydralazine IV. -2-D echo showed  concentric hypertrophy consistent with uncontrolled HTN, follow-up with PCP.  Thyroid enlargement:  -CT scan showed thyroid enlargement, normal TSH, follow-up with PCP might need endocrinology/thyroid ultrasound.  Nonsustained V. tach -Had 15 beats of nonsustained V. tach on central telemetry, patient was asymptomatic. -Potassium repleted, started on metoprolol 25 mg for hypertension and LVH.  Tobacco abuse Counseled extensively  All these findings discussed with daughter prior to discharge.  Discharge Instructions  Discharge Instructions    Diet - low sodium heart healthy    Complete by:  As directed    Increase activity slowly    Complete by:  As directed        Medication List    TAKE these medications   ALEVE 220 MG tablet Generic drug:  naproxen sodium Take 440 mg by mouth daily as needed (for pain).   amLODipine 5 MG tablet Commonly known as:  NORVASC Take 1 tablet (5 mg total) by mouth daily. Start taking on:  03/13/2016   aspirin EC 81 MG tablet Take 81 mg by mouth daily as needed for mild pain.   hydrochlorothiazide 25 MG tablet Commonly known as:  HYDRODIURIL Take 1 tablet (25 mg total) by mouth daily. Start taking on:  03/13/2016   metoprolol tartrate 25 MG tablet Commonly known as:  LOPRESSOR Take 1 tablet (25 mg total) by mouth 2 (two) times daily.       Allergies  Allergen Reactions  . Flagyl [Metronidazole] Hives    Consultations:  None   Procedures/Studies: Dg Chest 2 View  Result Date: 03/10/2016 CLINICAL DATA:  Shortness of breath EXAM: CHEST  2 VIEW COMPARISON:  None. FINDINGS: Normal heart size. Aortic  atherosclerosis. Otherwise normal mediastinal contour. No pneumothorax. No pleural effusion. No pulmonary edema. No acute consolidative airspace disease. Slightly distorted appearance of the interstitial markings suggests emphysema. There is narrowing of the right deviated trachea at the level of the thoracic inlet. IMPRESSION: 1. No  acute cardiopulmonary disease. 2. Suggestion of emphysema. 3. Aortic atherosclerosis. 4. Narrowing of the right deviated trachea at the level of the thoracic inlet, most commonly due to an asymmetric goiter. Recommend correlation with thyroid ultrasound. Electronically Signed   By: Delbert Phenix M.D.   On: 03/10/2016 18:21   Ct Angio Chest Pe W And/or Wo Contrast  Result Date: 03/11/2016 CLINICAL DATA:  Acute onset of shortness of breath. Recent cold. Initial encounter. EXAM: CT ANGIOGRAPHY CHEST WITH CONTRAST TECHNIQUE: Multidetector CT imaging of the chest was performed using the standard protocol during bolus administration of intravenous contrast. Multiplanar CT image reconstructions and MIPs were obtained to evaluate the vascular anatomy. CONTRAST:  70 mL of Isovue 370 IV contrast COMPARISON:  Chest radiograph performed earlier today at 5:58 p.m. at the skull is FINDINGS: Cardiovascular:  There is no evidence of pulmonary embolus. The heart is unremarkable in appearance. Scattered coronary artery calcifications are seen. Scattered calcification is also noted at the aortic arch and proximal great vessels. Mediastinum/Nodes: The mediastinum is otherwise unremarkable in appearance. No mediastinal lymphadenopathy is seen. No pericardial effusion is identified. There is marked diffuse enlargement of the thyroid, particularly on the left, with rightward displacement and narrowing of the trachea at this level. No axillary lymphadenopathy is seen. Lungs/Pleura: Minimal bilateral atelectasis is noted. The lungs are otherwise grossly clear. No focal consolidation, pleural effusion or pneumothorax is seen. No masses are identified. Upper Abdomen: The visualized portions of the liver and spleen are grossly unremarkable. The visualized portions of the pancreas, adrenal glands and kidneys are within normal limits. Musculoskeletal: No acute osseous abnormalities are identified. Multilevel vacuum phenomenon is noted along  the lower thoracic spine. The visualized musculature is unremarkable in appearance. Review of the MIP images confirms the above findings. IMPRESSION: 1. No evidence of pulmonary embolus. 2. Scattered coronary artery calcifications seen. 3. Minimal bilateral atelectasis noted. Lungs otherwise grossly clear. 4. **An incidental finding of potential clinical significance has been found. Marked diffuse enlargement of the thyroid, particularly on the left, with rightward displacement and narrowing of the trachea at this level. Though this could reflect a large thyroid goiter, an underlying left thyroid mass cannot be excluded. Recommend further evaluation with thyroid ultrasound. If patient is clinically hyperthyroid, consider nuclear medicine thyroid uptake and scan.** Electronically Signed   By: Roanna Raider M.D.   On: 03/11/2016 01:17    (Echo, Carotid, EGD, Colonoscopy, ERCP)    Subjective:   Discharge Exam: Vitals:   03/12/16 1406 03/12/16 1452  BP: (!) 198/75 (!) 159/89  Pulse: 85   Resp: 18 (!) 22  Temp: 98.5 F (36.9 C)    Vitals:   03/12/16 0746 03/12/16 0826 03/12/16 1406 03/12/16 1452  BP: 108/90 (!) 187/89 (!) 198/75 (!) 159/89  Pulse: 82 86 85   Resp: 18  18 (!) 22  Temp: 98 F (36.7 C)  98.5 F (36.9 C)   TempSrc: Oral  Oral   SpO2: 97%  97%   Weight:      Height:        General: Pt is alert, awake, not in acute distress Cardiovascular: RRR, S1/S2 +, no rubs, no gallops Respiratory: CTA bilaterally, no wheezing, no rhonchi Abdominal: Soft, NT, ND,  bowel sounds + Extremities: no edema, no cyanosis    The results of significant diagnostics from this hospitalization (including imaging, microbiology, ancillary and laboratory) are listed below for reference.     Microbiology: Recent Results (from the past 240 hour(s))  MRSA PCR Screening     Status: None   Collection Time: 03/11/16  4:52 PM  Result Value Ref Range Status   MRSA by PCR NEGATIVE NEGATIVE Final     Comment:        The GeneXpert MRSA Assay (FDA approved for NASAL specimens only), is one component of a comprehensive MRSA colonization surveillance program. It is not intended to diagnose MRSA infection nor to guide or monitor treatment for MRSA infections.      Labs: BNP (last 3 results)  Recent Labs  03/11/16 1039  BNP 303.2*   Basic Metabolic Panel:  Recent Labs Lab 03/10/16 1800 03/12/16 0302  NA 136 136  K 4.1 3.4*  CL 104 99*  CO2 22 26  GLUCOSE 95 98  BUN 12 15  CREATININE 0.85 0.97  CALCIUM 9.0 9.3   Liver Function Tests:  Recent Labs Lab 03/10/16 1800  AST 27  ALT 16  ALKPHOS 93  BILITOT 0.7  PROT 8.1  ALBUMIN 3.9   No results for input(s): LIPASE, AMYLASE in the last 168 hours. No results for input(s): AMMONIA in the last 168 hours. CBC:  Recent Labs Lab 03/10/16 1800 03/12/16 0302  WBC 8.1 4.8  HGB 13.2 12.8  HCT 41.7 40.8  MCV 76.1* 74.9*  PLT 225 221   Cardiac Enzymes:  Recent Labs Lab 03/11/16 1039 03/11/16 1420 03/11/16 2043 03/12/16 0302  TROPONINI 0.06* 0.05* 0.05* 0.04*   BNP: Invalid input(s): POCBNP CBG: No results for input(s): GLUCAP in the last 168 hours. D-Dimer  Recent Labs  03/10/16 2119  DDIMER 1.24*   Hgb A1c No results for input(s): HGBA1C in the last 72 hours. Lipid Profile No results for input(s): CHOL, HDL, LDLCALC, TRIG, CHOLHDL, LDLDIRECT in the last 72 hours. Thyroid function studies  Recent Labs  03/11/16 1039  TSH 1.262   Anemia work up No results for input(s): VITAMINB12, FOLATE, FERRITIN, TIBC, IRON, RETICCTPCT in the last 72 hours. Urinalysis No results found for: COLORURINE, APPEARANCEUR, LABSPEC, PHURINE, GLUCOSEU, HGBUR, BILIRUBINUR, KETONESUR, PROTEINUR, UROBILINOGEN, NITRITE, LEUKOCYTESUR Sepsis Labs Invalid input(s): PROCALCITONIN,  WBC,  LACTICIDVEN Microbiology Recent Results (from the past 240 hour(s))  MRSA PCR Screening     Status: None   Collection Time: 03/11/16   4:52 PM  Result Value Ref Range Status   MRSA by PCR NEGATIVE NEGATIVE Final    Comment:        The GeneXpert MRSA Assay (FDA approved for NASAL specimens only), is one component of a comprehensive MRSA colonization surveillance program. It is not intended to diagnose MRSA infection nor to guide or monitor treatment for MRSA infections.      Time coordinating discharge: Over 30 minutes  SIGNED:   Clint LippsELMAHI,Violetta Lavalle A, MD  [facility] 03/12/2016, 2:59 PM Pager   If 7PM-7AM, please contact night-coverage [email or web address] Password [email or web address]

## 2016-03-12 NOTE — Progress Notes (Signed)
Pt had a 15 beat run of VTach per central telemetry monitoring. MD made aware. Pt asymptomatic, no chest pain, no shortness of breath, resting. Will continue to monitor and pass info to day shift.

## 2016-03-12 NOTE — Progress Notes (Signed)
  Echocardiogram 2D Echocardiogram has been performed.  Mary Cherry 03/12/2016, 12:26 PM

## 2016-03-12 NOTE — Progress Notes (Signed)
Pt received discharge teaching. Pt IV was removed. Pt understood instructions and had no further questions.

## 2018-05-13 IMAGING — CT CT ANGIO CHEST
1 of 9 series · 12 of 36 positions shown · IV contrast (Iohexol (Omnipaque 350))
Comparison: Chest radiograph performed earlier today at [DATE] p.m.
at the skull is

CLINICAL DATA: Acute onset of shortness of breath. Recent cold.
Initial encounter.

EXAM:
CT ANGIOGRAPHY CHEST WITH CONTRAST
TECHNIQUE: Multidetector CT imaging of the chest was performed using the
standard protocol during bolus administration of intravenous
contrast. Multiplanar CT image reconstructions and MIPs were
obtained to evaluate the vascular anatomy.
CONTRAST:  70 mL of Isovue 370 IV contrast

[Series 506: thins pacs · axial · 0.62mm/px · z∈[+76,+318]mm · 12 of 287 slices shown]
[im 23/287  lung]
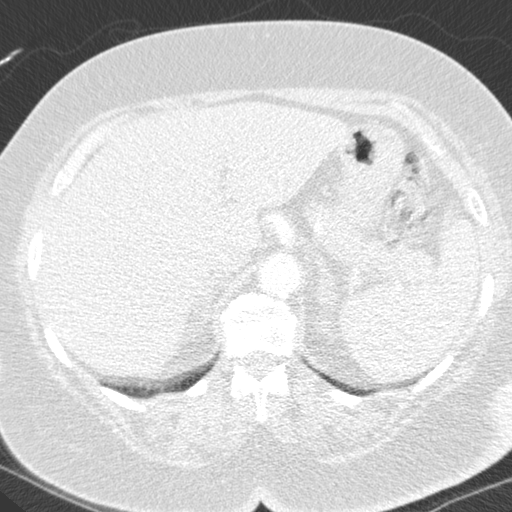
[im 45/287  mediastinal]
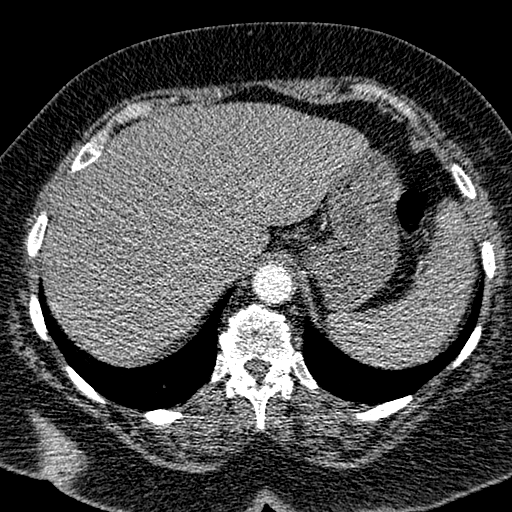
[im 67/287  lung]
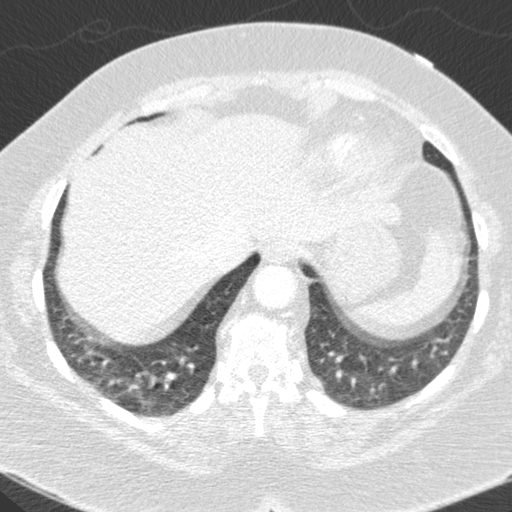
[im 89/287  mediastinal]
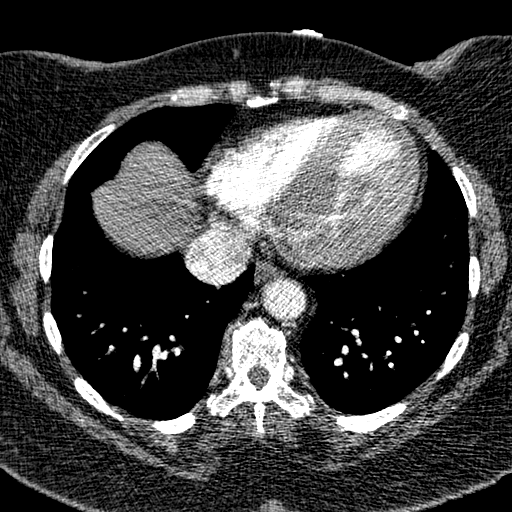
[im 111/287  lung]
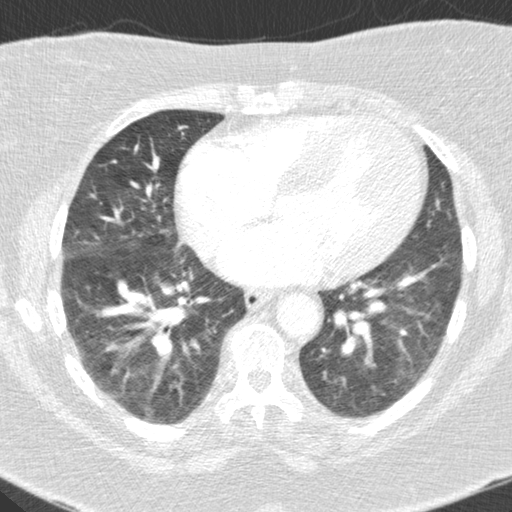
[im 133/287  mediastinal]
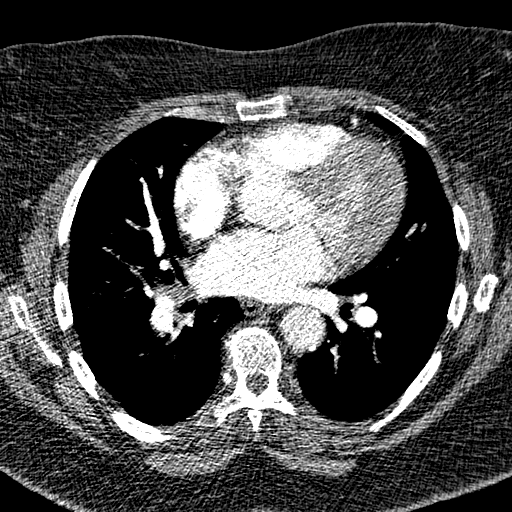
[im 155/287  lung]
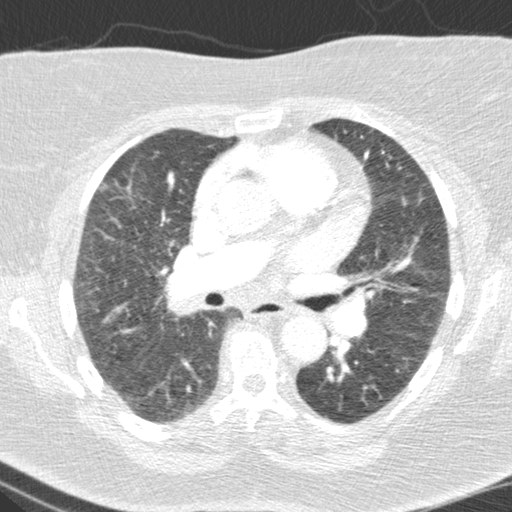
[im 177/287  mediastinal]
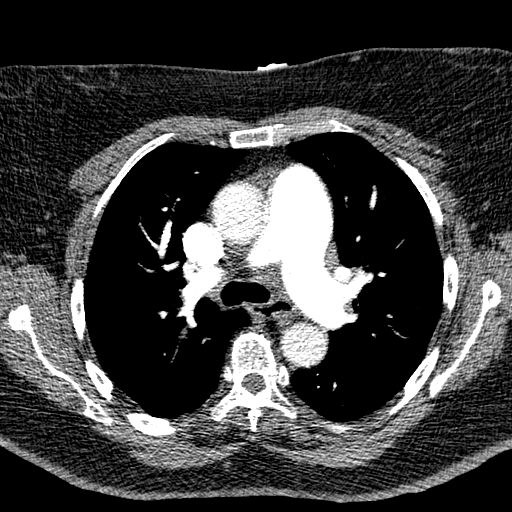
[im 199/287  lung]
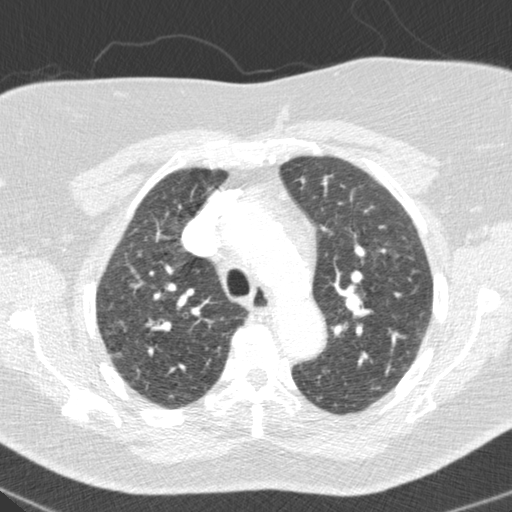
[im 221/287  mediastinal]
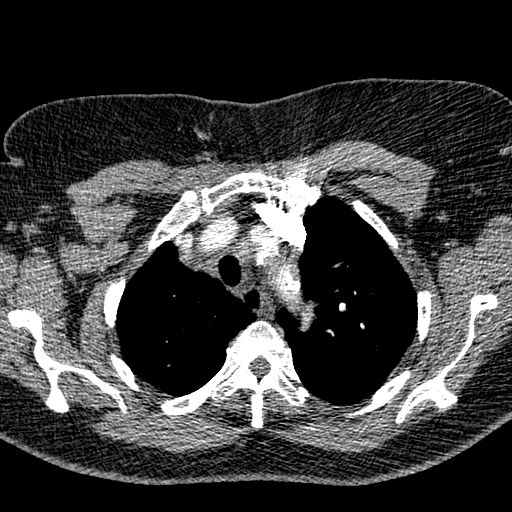
[im 243/287  lung]
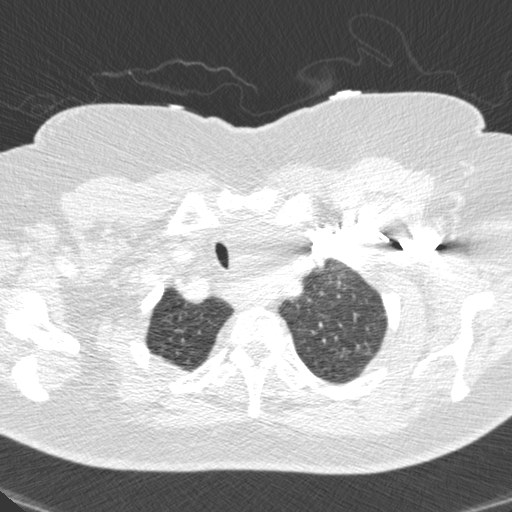
[im 265/287  mediastinal]
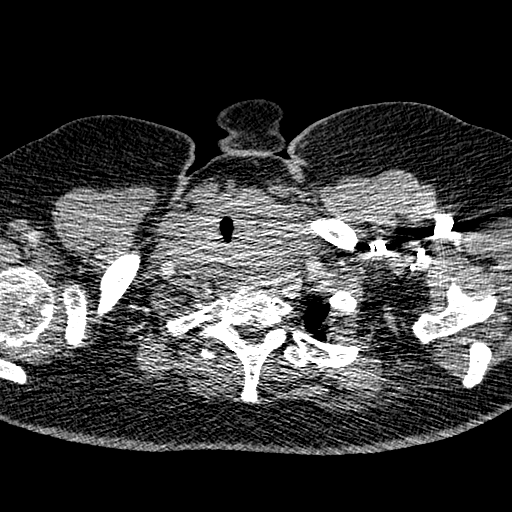

[12 of 36 positions shown; findings below may reference images not displayed]

FINDINGS: Cardiovascular:  There is no evidence of pulmonary embolus.

The heart is unremarkable in appearance. Scattered coronary artery
calcifications are seen. Scattered calcification is also noted at
the aortic arch and proximal great vessels.

Mediastinum/Nodes: The mediastinum is otherwise unremarkable in
appearance. No mediastinal lymphadenopathy is seen. No pericardial
effusion is identified. There is marked diffuse enlargement of the
thyroid, particularly on the left, with rightward displacement and
narrowing of the trachea at this level. No axillary lymphadenopathy
is seen.

Lungs/Pleura: Minimal bilateral atelectasis is noted. The lungs are
otherwise grossly clear. No focal consolidation, pleural effusion or
pneumothorax is seen. No masses are identified.

Upper Abdomen: The visualized portions of the liver and spleen are
grossly unremarkable. The visualized portions of the pancreas,
adrenal glands and kidneys are within normal limits.

Musculoskeletal: No acute osseous abnormalities are identified.
Multilevel vacuum phenomenon is noted along the lower thoracic
spine. The visualized musculature is unremarkable in appearance.

Review of the MIP images confirms the above findings.
IMPRESSION: 1. No evidence of pulmonary embolus.
2. Scattered coronary artery calcifications seen.
3. Minimal bilateral atelectasis noted. Lungs otherwise grossly
clear.
4. **An incidental finding of potential clinical significance has
been found. Marked diffuse enlargement of the thyroid, particularly
on the left, with rightward displacement and narrowing of the
trachea at this level. Though this could reflect a large thyroid
goiter, an underlying left thyroid mass cannot be excluded.
Recommend further evaluation with thyroid ultrasound. If patient is
clinically hyperthyroid, consider nuclear medicine thyroid uptake
and scan.**

## 2018-08-19 ENCOUNTER — Ambulatory Visit (INDEPENDENT_AMBULATORY_CARE_PROVIDER_SITE_OTHER): Payer: Medicare Other | Admitting: Family Medicine

## 2018-08-19 ENCOUNTER — Encounter: Payer: Self-pay | Admitting: Family Medicine

## 2018-08-19 VITALS — BP 176/74 | HR 88 | Temp 98.4°F | Ht 65.0 in | Wt 232.0 lb

## 2018-08-19 DIAGNOSIS — Z09 Encounter for follow-up examination after completed treatment for conditions other than malignant neoplasm: Secondary | ICD-10-CM | POA: Diagnosis not present

## 2018-08-19 DIAGNOSIS — I10 Essential (primary) hypertension: Secondary | ICD-10-CM

## 2018-08-19 DIAGNOSIS — E559 Vitamin D deficiency, unspecified: Secondary | ICD-10-CM | POA: Diagnosis not present

## 2018-08-19 DIAGNOSIS — R413 Other amnesia: Secondary | ICD-10-CM | POA: Diagnosis not present

## 2018-08-19 DIAGNOSIS — Z0000 Encounter for general adult medical examination without abnormal findings: Secondary | ICD-10-CM

## 2018-08-19 DIAGNOSIS — Z7689 Persons encountering health services in other specified circumstances: Secondary | ICD-10-CM

## 2018-08-19 DIAGNOSIS — Z1339 Encounter for screening examination for other mental health and behavioral disorders: Secondary | ICD-10-CM

## 2018-08-19 DIAGNOSIS — Z6838 Body mass index (BMI) 38.0-38.9, adult: Secondary | ICD-10-CM

## 2018-08-19 DIAGNOSIS — E66812 Obesity, class 2: Secondary | ICD-10-CM | POA: Insufficient documentation

## 2018-08-19 DIAGNOSIS — E538 Deficiency of other specified B group vitamins: Secondary | ICD-10-CM

## 2018-08-19 LAB — POCT URINALYSIS DIP (MANUAL ENTRY)
Bilirubin, UA: NEGATIVE
Glucose, UA: NEGATIVE mg/dL
Ketones, POC UA: NEGATIVE mg/dL
Leukocytes, UA: NEGATIVE
Nitrite, UA: NEGATIVE
Spec Grav, UA: 1.02 (ref 1.010–1.025)
Urobilinogen, UA: 2 E.U./dL — AB
pH, UA: 7 (ref 5.0–8.0)

## 2018-08-19 MED ORDER — AMLODIPINE BESYLATE 5 MG PO TABS: 5.0000 mg | ORAL_TABLET | Freq: Every day | ORAL | 1 refills | Status: AC

## 2018-08-19 MED ORDER — METOPROLOL TARTRATE 25 MG PO TABS: 25.0000 mg | ORAL_TABLET | Freq: Two times a day (BID) | ORAL | 1 refills | Status: AC

## 2018-08-19 MED ORDER — HYDROCHLOROTHIAZIDE 25 MG PO TABS: 25.0000 mg | ORAL_TABLET | Freq: Every day | ORAL | 1 refills | Status: AC

## 2018-08-19 NOTE — Progress Notes (Signed)
[facility]  New Patient--Establish Care  Subjective:  Patient ID: Mary Cherry, female    DOB: 12-15-1945  Age: 73 y.o. MRN: [id number]  CC:  Chief Complaint  Patient presents with  . Establish Care    A1C 5.7   HPI Mary Cherry is a 73 year old female who presents to Establish Care today.   Past Medical History:  Diagnosis Date  . Essential hypertension   . Essential hypertension 03/11/2016  . Hyperlipidemia   . Obesity due to excess calories    Current Status: Since her last office visit, she was a previous patient of Dr. Leanor Kail at [facility]. She last saw him in 2018. She has not been taking her antihypertensive medications as prescribed. She denies visual changes, chest pain, cough, shortness of breath, heart palpitations, and falls. She has occasional headaches and dizziness with position changes. Denies severe headaches, confusion, seizures, double vision, and blurred vision, nausea and vomiting. Her anxiety is mild today. She has no social outlet outside of her family. She denies suicidal ideations, homicidal ideations, or auditory hallucinations. She has mild chronic lower back pain.   She denies fevers, chills, fatigue, recent infections, weight loss, and night sweats. No reports of GI problems such as diarrhea, and constipation. She has no reports of blood in stools, dysuria and hematuria. She denies pain today.   Past Surgical History:  Procedure Laterality Date  . ABDOMINAL HYSTERECTOMY  1997  . CESAREAN SECTION  1984    Family History  Problem Relation Age of Onset  . Hypertension Mother   . Emphysema Mother   . Pancreatic cancer Mother   . Heart attack Father   . Hypertension Brother   . Stroke Maternal Aunt     Social History   Socioeconomic History  . Marital status: Single    Spouse name: Not on file  . Number of children: Not on file  . Years of education: Not on file  . Highest education  level: Not on file  Occupational History  . Not on file  Social Needs  . Financial resource strain: Not on file  . Food insecurity:    Worry: Not on file    Inability: Not on file  . Transportation needs:    Medical: Not on file    Non-medical: Not on file  Tobacco Use  . Smoking status: Current Every Day Smoker    Types: Cigarettes  . Smokeless tobacco: Never Used  Substance and Sexual Activity  . Alcohol use: Not on file  . Drug use: Not on file  . Sexual activity: Not on file  Lifestyle  . Physical activity:    Days per week: Not on file    Minutes per session: Not on file  . Stress: Not on file  Relationships  . Social connections:    Talks on phone: Not on file    Gets together: Not on file    Attends religious service: Not on file    Active member of club or organization: Not on file    Attends meetings of clubs or organizations: Not on file    Relationship status: Not on file  . Intimate partner violence:    Fear of current or ex partner: Not on file    Emotionally abused: Not on file    Physically abused: Not on file    Forced sexual activity: Not on file  Other Topics Concern  . Not on file  Social History Narrative  . Not on file    Outpatient Medications Prior to Visit  Medication Sig Dispense Refill  . aspirin EC 81 MG tablet Take 81 mg by mouth daily as needed for mild pain.    . naproxen sodium (ALEVE) 220 MG tablet Take 440 mg by mouth daily as needed (for pain).    Marland Kitchen amLODipine (NORVASC) 5 MG tablet Take 1 tablet (5 mg total) by mouth daily. (Patient not taking: Reported on 08/19/2018) 30 tablet 1  . hydrochlorothiazide (HYDRODIURIL) 25 MG tablet Take 1 tablet (25 mg total) by mouth daily. (Patient not taking: Reported on 08/19/2018) 30 tablet 1  . metoprolol tartrate (LOPRESSOR) 25 MG tablet Take 1 tablet (25 mg total) by mouth 2 (two) times daily. (Patient not taking: Reported on 08/19/2018) 60 tablet 1   No facility-administered medications prior to  visit.     Allergies  Allergen Reactions  . Flagyl [Metronidazole] Hives   ROS Review of Systems  Constitutional: Negative.   HENT: Negative.   Eyes: Negative.   Respiratory: Negative.   Cardiovascular: Negative.   Gastrointestinal: Negative.   Endocrine: Negative.   Genitourinary: Negative.   Musculoskeletal: Positive for back pain (mild).  Skin: Negative.   Neurological: Positive for dizziness and headaches.  Hematological: Negative.   Psychiatric/Behavioral: Negative.        Memory changes   Objective:   Physical Exam  Constitutional: She is oriented to person, place, and time. She appears well-developed and well-nourished.  HENT:  Head: Normocephalic and atraumatic.  Eyes: Conjunctivae are normal.  Neck: Normal range of motion. Neck supple.  Cardiovascular: Normal rate, regular rhythm, normal heart sounds and intact distal pulses.  Pulmonary/Chest: Breath sounds normal.  Abdominal: Soft. Bowel sounds are normal. She exhibits distension.  Musculoskeletal: Normal range of motion.  Neurological: She is alert and oriented to person, place, and time.  Skin: Skin is warm and dry.  Psychiatric: She has a normal mood and affect. Her behavior is normal. Judgment and thought content normal.  Nursing note and vitals reviewed.  BP (!) 200/82 (BP Location: Right Arm, Patient Position: Sitting, Cuff Size: Large)   Pulse 88   Temp 98.4 F (36.9 C) (Oral)   Ht 5\' 5"  (1.651 m)   Wt 232 lb (105.2 kg)   SpO2 100%   BMI 38.61 kg/m  Wt Readings from Last 3 Encounters:  08/19/18 232 lb (105.2 kg)  03/12/16 232 lb 8 oz (105.5 kg)     Health Maintenance Due  Topic Date Due  . Hepatitis C Screening  29-Mar-1946  . MAMMOGRAM  04/10/1996  . COLONOSCOPY  04/10/1996  . DEXA SCAN  04/11/2011    There are no preventive care reminders to display for this patient.  Lab Results  Component Value Date   TSH 1.262 03/11/2016   Lab Results  Component Value Date   WBC 4.8  03/12/2016   HGB 12.8 03/12/2016   HCT 40.8 03/12/2016   MCV 74.9 (L) 03/12/2016   PLT 221 03/12/2016   Lab Results  Component Value Date   NA 136 03/12/2016   K 3.4 (L) 03/12/2016   CO2 26 03/12/2016   GLUCOSE 98 03/12/2016   BUN 15 03/12/2016   CREATININE 0.97 03/12/2016   BILITOT 0.7 03/10/2016   ALKPHOS 93 03/10/2016   AST 27 03/10/2016   ALT 16 03/10/2016   PROT 8.1 03/10/2016   ALBUMIN 3.9 03/10/2016   CALCIUM 9.3 03/12/2016   ANIONGAP 11 03/12/2016   No  results found for: CHOL No results found for: HDL No results found for: LDLCALC No results found for: TRIG No results found for: CHOLHDL No results found for: NUUV2Z  Assessment & Plan:   1. Encounter to establish care - POCT urinalysis dipstick  2. Hypertension, unspecified type She will restart taking her blood pressure medications as prescribed. She will continue to decrease high sodium intake, excessive alcohol intake, increase potassium intake, smoking cessation, and increase physical activity of at least 30 minutes of cardio activity daily. She will continue to follow Heart Healthy or DASH diet. - amLODipine (NORVASC) 5 MG tablet; Take 1 tablet (5 mg total) by mouth daily.  Dispense: 30 tablet; Refill: 1 - hydrochlorothiazide (HYDRODIURIL) 25 MG tablet; Take 1 tablet (25 mg total) by mouth daily.  Dispense: 30 tablet; Refill: 1 - metoprolol tartrate (LOPRESSOR) 25 MG tablet; Take 1 tablet (25 mg total) by mouth 2 (two) times daily.  Dispense: 60 tablet; Refill: 1 - Comprehensive metabolic panel - TSH - Lipid Panel - Vitamin D, 25-hydroxy - Vitamin B12 - CBC with Differential  3. Class 2 severe obesity due to excess calories with serious comorbidity and body mass index (BMI) of 38.0 to 38.9 in adult [facility]) Body mass index is 38.61 kg/m.  Goal BMI  is <30. Encouraged efforts to reduce weight include engaging in physical activity as tolerated with goal of 150 minutes per week. Improve dietary choices and eat a  meal regimen consistent with a Mediterranean or DASH diet. Reduce simple carbohydrates. Do not skip meals and eat healthy snacks throughout the day to avoid over-eating at dinner. Set a goal weight loss that is achievable for you. - Comprehensive metabolic panel - TSH - Lipid Panel - Vitamin D, 25-hydroxy - Vitamin B12 - CBC with Differential  4. Memory changes She had minimal changes in memory.  5. Encounter for screening examination for other mental health and behavioral disorders Mini-Mental Assessment Exam performed today. Score of 19 is stable. We will continue to monitor.   6. Healthcare maintenance - Comprehensive metabolic panel - TSH - Lipid Panel  7. Vitamin D deficiency Labs are pending.  - Vitamin D, 25-hydroxy  8. Vitamin B 12 deficiency Labs are pending.  - Vitamin B12  9. Follow up She will follow up in 1 month.   Meds ordered this encounter  Medications  . amLODipine (NORVASC) 5 MG tablet    Sig: Take 1 tablet (5 mg total) by mouth daily.    Dispense:  30 tablet    Refill:  1  . hydrochlorothiazide (HYDRODIURIL) 25 MG tablet    Sig: Take 1 tablet (25 mg total) by mouth daily.    Dispense:  30 tablet    Refill:  1  . metoprolol tartrate (LOPRESSOR) 25 MG tablet    Sig: Take 1 tablet (25 mg total) by mouth 2 (two) times daily.    Dispense:  60 tablet    Refill:  1   Orders Placed This Encounter  Procedures  . Comprehensive metabolic panel  . TSH  . Lipid Panel  . Vitamin D, 25-hydroxy  . Vitamin B12  . CBC with Differential  . POCT urinalysis dipstick    Referral Orders  No referral(s) requested today   Raliegh Ip,  MSN, FNP-C [facility] [address] [place], [place] [address] [phone]   Problem List Items Addressed This Visit    None    Visit Diagnoses    Encounter to  establish care    -  Primary   Relevant Orders   POCT urinalysis dipstick (Completed)   Hypertension, unspecified  type       Relevant Medications   amLODipine (NORVASC) 5 MG tablet   hydrochlorothiazide (HYDRODIURIL) 25 MG tablet   metoprolol tartrate (LOPRESSOR) 25 MG tablet   Other Relevant Orders   Comprehensive metabolic panel   TSH   Lipid Panel   Vitamin D, 25-hydroxy   Vitamin B12   CBC with Differential   Class 2 severe obesity due to excess calories with serious comorbidity and body mass index (BMI) of 38.0 to 38.9 in adult [facility])       Relevant Orders   Comprehensive metabolic panel   TSH   Lipid Panel   Vitamin D, 25-hydroxy   Vitamin B12   CBC with Differential   Memory changes       Encounter for screening examination for other mental health and behavioral disorders       Healthcare maintenance       Relevant Orders   Comprehensive metabolic panel   TSH   Lipid Panel   Vitamin D deficiency       Relevant Orders   Vitamin D, 25-hydroxy   Vitamin B 12 deficiency       Relevant Orders   Vitamin B12   Follow up          Meds ordered this encounter  Medications  . amLODipine (NORVASC) 5 MG tablet    Sig: Take 1 tablet (5 mg total) by mouth daily.    Dispense:  30 tablet    Refill:  1  . hydrochlorothiazide (HYDRODIURIL) 25 MG tablet    Sig: Take 1 tablet (25 mg total) by mouth daily.    Dispense:  30 tablet    Refill:  1  . metoprolol tartrate (LOPRESSOR) 25 MG tablet    Sig: Take 1 tablet (25 mg total) by mouth 2 (two) times daily.    Dispense:  60 tablet    Refill:  1    Follow-up: Return in about 1 month (around 09/17/2018).    Kallie Locks, FNP

## 2018-08-20 LAB — LIPID PANEL
Chol/HDL Ratio: 4.9 ratio — ABNORMAL HIGH (ref 0.0–4.4)
Cholesterol, Total: 226 mg/dL — ABNORMAL HIGH (ref 100–199)
HDL: 46 mg/dL (ref >39)
LDL Calculated: 165 mg/dL — ABNORMAL HIGH (ref 0–99)
Triglycerides: 77 mg/dL (ref 0–149)
VLDL Cholesterol Cal: 15 mg/dL (ref 5–40)

## 2018-08-20 LAB — CBC WITH DIFFERENTIAL/PLATELET
Basophils Absolute: 0 10*3/uL (ref 0.0–0.2)
Basos: 1 %
EOS (ABSOLUTE): 0.1 10*3/uL (ref 0.0–0.4)
Eos: 2 %
Hematocrit: 38.3 % (ref 34.0–46.6)
Hemoglobin: 12.5 g/dL (ref 11.1–15.9)
Immature Grans (Abs): 0 10*3/uL (ref 0.0–0.1)
Immature Granulocytes: 0 %
Lymphocytes Absolute: 1 10*3/uL (ref 0.7–3.1)
Lymphs: 30 %
MCH: 24.6 pg — ABNORMAL LOW (ref 26.6–33.0)
MCHC: 32.6 g/dL (ref 31.5–35.7)
MCV: 75 fL — ABNORMAL LOW (ref 79–97)
Monocytes Absolute: 0.2 10*3/uL (ref 0.1–0.9)
Monocytes: 6 %
Neutrophils Absolute: 2.2 10*3/uL (ref 1.4–7.0)
Neutrophils: 61 %
Platelets: 219 10*3/uL (ref 150–450)
RBC: 5.09 x10E6/uL (ref 3.77–5.28)
RDW: 15.1 % (ref 11.7–15.4)
WBC: 3.5 10*3/uL (ref 3.4–10.8)

## 2018-08-20 LAB — COMPREHENSIVE METABOLIC PANEL
ALT: 8 IU/L (ref 0–32)
AST: 14 IU/L (ref 0–40)
Albumin/Globulin Ratio: 1.3 (ref 1.2–2.2)
Albumin: 4.2 g/dL (ref 3.7–4.7)
Alkaline Phosphatase: 105 IU/L (ref 39–117)
BUN/Creatinine Ratio: 19 (ref 12–28)
BUN: 17 mg/dL (ref 8–27)
Bilirubin Total: 0.5 mg/dL (ref 0.0–1.2)
CO2: 23 mmol/L (ref 20–29)
Calcium: 9.3 mg/dL (ref 8.7–10.3)
Chloride: 103 mmol/L (ref 96–106)
Creatinine, Ser: 0.88 mg/dL (ref 0.57–1.00)
GFR calc Af Amer: 76 mL/min/{1.73_m2} (ref >59)
GFR calc non Af Amer: 66 mL/min/{1.73_m2} (ref >59)
Globulin, Total: 3.2 g/dL (ref 1.5–4.5)
Glucose: 87 mg/dL (ref 65–99)
Potassium: 4.4 mmol/L (ref 3.5–5.2)
Sodium: 141 mmol/L (ref 134–144)
Total Protein: 7.4 g/dL (ref 6.0–8.5)

## 2018-08-20 LAB — VITAMIN B12: Vitamin B-12: 271 pg/mL (ref 232–1245)

## 2018-08-20 LAB — TSH: TSH: 0.928 u[IU]/mL (ref 0.450–4.500)

## 2018-08-20 LAB — VITAMIN D 25 HYDROXY (VIT D DEFICIENCY, FRACTURES): Vit D, 25-Hydroxy: 6.9 ng/mL — ABNORMAL LOW (ref 30.0–100.0)

## 2018-08-22 ENCOUNTER — Other Ambulatory Visit: Payer: Self-pay | Admitting: Family Medicine

## 2018-08-22 DIAGNOSIS — E559 Vitamin D deficiency, unspecified: Secondary | ICD-10-CM

## 2018-08-22 MED ORDER — VITAMIN D (ERGOCALCIFEROL) 1.25 MG (50000 UNIT) PO CAPS: 50000.0000 [IU] | ORAL_CAPSULE | ORAL | 3 refills | Status: AC

## 2018-09-21 ENCOUNTER — Ambulatory Visit: Payer: Medicare Other | Admitting: Family Medicine

## 2018-09-28 ENCOUNTER — Telehealth: Payer: Self-pay | Admitting: Family Medicine

## 2018-09-28 NOTE — Telephone Encounter (Signed)
Multiple attempts to contact patient's daughter to assess patient's blood pressure and follow up. Unable to leave message at number listed in patient's chart: [phone].

## 2018-09-28 NOTE — Telephone Encounter (Signed)
Multiple attempts to contact patient concerning elevated blood pressures. Messages left on her voicemail to return call to office.

## 2018-12-08 ENCOUNTER — Telehealth: Payer: Self-pay

## 2018-12-08 NOTE — Telephone Encounter (Signed)
Left a vm for patient to callback and do the screening  

## 2018-12-09 ENCOUNTER — Ambulatory Visit: Payer: Medicare Other | Admitting: Family Medicine

## 2019-12-13 ENCOUNTER — Encounter: Payer: Medicare Other | Admitting: Family Medicine

## 2022-02-22 DEATH — deceased
